# Patient Record
Sex: Female | Born: 1990 | Race: Black or African American | Hispanic: No | Marital: Single | Smoking: Current every day smoker
Health system: Southern US, Community
[De-identification: ages and names within clinical notes are randomized; demographics above are authoritative.]

---

## 2017-01-16 ENCOUNTER — Other Ambulatory Visit: Payer: Self-pay

## 2017-01-16 ENCOUNTER — Emergency Department
Admission: EM | Admit: 2017-01-16 | Discharge: 2017-01-16 | Disposition: A | Discharge status: Home / Self Care | Payer: Managed Care, Other (non HMO) | Attending: Emergency Medicine | Admitting: Emergency Medicine

## 2017-01-16 ENCOUNTER — Encounter: Payer: Self-pay | Admitting: Physician Assistant

## 2017-01-16 DIAGNOSIS — H1030 Unspecified acute conjunctivitis, unspecified eye: Secondary | ICD-10-CM

## 2017-01-16 DIAGNOSIS — H10023 Other mucopurulent conjunctivitis, bilateral: Secondary | ICD-10-CM | POA: Insufficient documentation

## 2017-01-16 DIAGNOSIS — H5713 Ocular pain, bilateral: Secondary | ICD-10-CM | POA: Diagnosis present

## 2017-01-16 DIAGNOSIS — F17200 Nicotine dependence, unspecified, uncomplicated: Secondary | ICD-10-CM | POA: Insufficient documentation

## 2017-01-16 MED ORDER — TOBRAMYCIN 0.3 % OP SOLN: 2.0000 [drp] | OPHTHALMIC | 0 refills | Status: DC

## 2017-01-16 NOTE — Discharge Instructions (Signed)
Follow-up with your regular doctor or the eye doctor who is phone number we have given you if you are not better in 3 days, if you feel that your vision is becoming worse or you are having more eye pain please return to the emergency department, use the medication as prescribed

## 2017-01-16 NOTE — ED Provider Notes (Signed)
[facility] Emergency Department Provider Note  ____________________________________________   First MD Initiated Contact with Patient 01/16/17 1156     (approximate)  I have reviewed the triage vital signs and the nursing notes.   HISTORY  Chief Complaint Eye Pain    HPI Sherry Martin is a 27 y.o. 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 No pertinent past medical history.  There are no active problems to display for this patient.   Past Surgical History:  Procedure Laterality Date  . CESAREAN SECTION      Prior to Admission medications   Medication Sig Start Date End Date Taking? Authorizing Provider  tobramycin (TOBREX) 0.3 % ophthalmic solution Place 2 drops into the left eye every 4 (four) hours. 01/16/17   Faythe Ghee, PA-C    Allergies Patient has no known allergies.  No family history on file.  Social History Social History   Tobacco Use  . Smoking status: Current Every Day Smoker  . Smokeless tobacco: Never Used  Substance Use Topics  . Alcohol use: No    Frequency: Never  . Drug use: No    Review of Systems  Constitutional: No fever/chills Eyes: some visual changes with blurred vision, positive for drainage and crusting ENT: No sore throat. Respiratory: Denies cough Genitourinary: Negative for dysuria. Musculoskeletal: Negative for back pain. Skin: Negative for rash.    ____________________________________________   PHYSICAL EXAM:  VITAL SIGNS: ED Triage Vitals  Enc Vitals Group     BP 01/16/17 1201 125/68     Pulse Rate  01/16/17 1201 79     Resp 01/16/17 1201 16     Temp 01/16/17 1201 98.1 F (36.7 C)     Temp Source 01/16/17 1201 Oral     SpO2 01/16/17 1201 99 %     Weight 01/16/17 1202 205 lb (93 kg)     Height 01/16/17 1202 4\' 11"  (1.499 m)     Head Circumference --      Peak Flow --      Pain Score 01/16/17 1159 9     Pain Loc --      Pain Edu? --      Excl. in GC? --     Constitutional: Alert and oriented. Well appearing and in no acute distress. Eyes: Conjunctivae are inflamed, some crusting on lashes b/l, eomi intact, no pain is reproduced with movement, visual acuity is grossly normal Head: Atraumatic. Nose: No congestion/rhinnorhea. Mouth/Throat: Mucous membranes are moist.   Cardiovascular: Normal rate, regular rhythm.heart sounds are normal Respiratory: Normal respiratory effort.  No retractions, lungs are c t a GU: deferred Musculoskeletal: FROM all extremities, warm and well perfused Neurologic:  Normal speech and language.  Skin:  Skin is warm, dry and intact. No rash noted. Psychiatric: Mood and affect are normal. Speech and behavior are normal.  ____________________________________________   LABS (all labs ordered are listed, but only abnormal results are displayed)  Labs Reviewed - No data to display ____________________________________________   ____________________________________________  RADIOLOGY   ____________________________________________   PROCEDURES  Procedure(s) performed: No  Procedures  ____________________________________________   INITIAL IMPRESSION / ASSESSMENT AND PLAN / ED COURSE  Pertinent labs & imaging results that were available during my care of the patient were reviewed by me and considered in my medical decision making (see chart for details).  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, she  denies fever chills  On physical exam there is crusting on the lashes conjunctiva are irritated, extraocular motions were intact, her vision appears to be grossly intact, she is able to read the sign across the room  Diagnosis is acute conjunctivitis  Patient was given a prescription for tobramycin ophthalmic drops, she is to follow-up with her regular doctor or ophthalmology if not better in 3 days, she develops increased eye pain and has changes in vision she is to see ophthalmology as soon as possible, or she could return to the emergency department, she was given a work note as requested, she states she will comply with our recommendations, she is discharged in stable condition     As part of my medical decision making, I reviewed the following data within the electronic MEDICAL RECORD NUMBER  ____________________________________________   FINAL CLINICAL IMPRESSION(S) / ED DIAGNOSES  Final diagnoses:  Acute bacterial conjunctivitis, unspecified laterality      NEW MEDICATIONS STARTED DURING THIS VISIT:  Discharge Medication List as of 01/16/2017 12:14 PM    START taking these medications   Details  tobramycin (TOBREX) 0.3 % ophthalmic solution Place 2 drops into the left eye every 4 (four) hours., Starting Fri 01/16/2017, Print         Note:  This document was prepared using Dragon voice recognition software and may include unintentional dictation errors.    Faythe GheeFisher, Elmer Merwin W, PA-C 01/16/17 1455    Jene EveryKinner, Robert, MD 01/16/17 [phone]

## 2017-01-16 NOTE — ED Triage Notes (Signed)
Per patient's report, she has experienced bilateral eye pain, drainage and photosensitivity since starting a new job. Patient c/o burning pain around eyes. Patient c/o nasal congestion.

## 2018-02-06 ENCOUNTER — Encounter: Payer: Self-pay | Admitting: Emergency Medicine

## 2018-02-06 ENCOUNTER — Other Ambulatory Visit: Payer: Self-pay

## 2018-02-06 ENCOUNTER — Emergency Department
Admission: EM | Admit: 2018-02-06 | Discharge: 2018-02-06 | Disposition: A | Discharge status: Home / Self Care | Payer: Managed Care, Other (non HMO) | Attending: Student in an Organized Health Care Education/Training Program | Admitting: Student in an Organized Health Care Education/Training Program

## 2018-02-06 DIAGNOSIS — B9689 Other specified bacterial agents as the cause of diseases classified elsewhere: Secondary | ICD-10-CM

## 2018-02-06 DIAGNOSIS — N760 Acute vaginitis: Secondary | ICD-10-CM | POA: Insufficient documentation

## 2018-02-06 DIAGNOSIS — F17200 Nicotine dependence, unspecified, uncomplicated: Secondary | ICD-10-CM | POA: Insufficient documentation

## 2018-02-06 DIAGNOSIS — Z202 Contact with and (suspected) exposure to infections with a predominantly sexual mode of transmission: Secondary | ICD-10-CM | POA: Insufficient documentation

## 2018-02-06 LAB — URINALYSIS, COMPLETE (UACMP) WITH MICROSCOPIC
Bacteria, UA: NONE SEEN
Bilirubin Urine: NEGATIVE
GLUCOSE, UA: NEGATIVE mg/dL
Hgb urine dipstick: NEGATIVE
KETONES UR: NEGATIVE mg/dL
Nitrite: NEGATIVE
PROTEIN: NEGATIVE mg/dL
Specific Gravity, Urine: 1.009 (ref 1.005–1.030)
pH: 7 (ref 5.0–8.0)

## 2018-02-06 LAB — WET PREP, GENITAL
Sperm: NONE SEEN
Trich, Wet Prep: NONE SEEN
Yeast Wet Prep HPF POC: NONE SEEN

## 2018-02-06 LAB — CHLAMYDIA/NGC RT PCR (ARMC ONLY)
CHLAMYDIA TR: NOT DETECTED
N gonorrhoeae: DETECTED — AB

## 2018-02-06 LAB — POCT PREGNANCY, URINE: Preg Test, Ur: NEGATIVE

## 2018-02-06 MED ORDER — LIDOCAINE HCL (PF) 1 % IJ SOLN
5.0000 mL | Freq: Once | INTRAMUSCULAR | Status: AC
  Administered 2018-02-06: 5 mL
  Filled 2018-02-06: qty 5

## 2018-02-06 MED ORDER — CEFTRIAXONE SODIUM 250 MG IJ SOLR
250.0000 mg | Freq: Once | INTRAMUSCULAR | Status: AC
  Administered 2018-02-06: 250 mg via INTRAMUSCULAR
  Filled 2018-02-06: qty 250

## 2018-02-06 MED ORDER — AZITHROMYCIN 500 MG PO TABS
1000.0000 mg | ORAL_TABLET | Freq: Once | ORAL | Status: AC
  Administered 2018-02-06: 1000 mg via ORAL
  Filled 2018-02-06: qty 2

## 2018-02-06 MED ORDER — SULFAMETHOXAZOLE-TRIMETHOPRIM 800-160 MG PO TABS: 1.0000 | ORAL_TABLET | Freq: Two times a day (BID) | ORAL | 0 refills | Status: DC

## 2018-02-06 MED ORDER — METRONIDAZOLE 500 MG PO TABS: 500.0000 mg | ORAL_TABLET | Freq: Two times a day (BID) | ORAL | 0 refills | Status: AC

## 2018-02-06 NOTE — ED Triage Notes (Signed)
Frequency of urination began this am.

## 2018-02-06 NOTE — Discharge Instructions (Addendum)
Follow-up with your primary care provider or can no clinic acute care if any continued problems.  Begin taking antibiotics as directed for the entire 7-day course, this medication is for the bacterial vaginosis.  While in the emergency department you were treated for both gonorrhea and chlamydia.  Should you wish to have testing for HIV or syphilis you may go to the health department and be tested for free.  No sexual activity for 7 to 10 days.

## 2018-02-06 NOTE — ED Provider Notes (Signed)
[facility] Emergency Department Provider Note  ____________________________________________   First MD Initiated Contact with Patient 02/06/18 [phone]     (approximate)  I have reviewed the triage vital signs and the nursing notes.   HISTORY  Chief Complaint Dysuria   HPI Sherry Martin is a 28 y.o. female   presents to the ED with frequency of urination beginning this morning.  Patient states that she has had urinary tract infections before and this feels very similar.  Her last UTI was 1 year ago.  Patient denies any nausea, vomiting, fever or chills.  There is been no flank pain.  She is also concerned about STDs.  She states that her boyfriend is accusing her of cheating on him.  She states that he was also seen in the ED but is uncertain if he was diagnosed with an STI.  She denies any pelvic pain or vaginal discharge.   History reviewed. No pertinent past medical history.  There are no active problems to display for this patient.   Past Surgical History:  Procedure Laterality Date  . CESAREAN SECTION      Prior to Admission medications   Medication Sig Start Date End Date Taking? Authorizing Provider  metroNIDAZOLE (FLAGYL) 500 MG tablet Take 1 tablet (500 mg total) by mouth 2 (two) times daily. 02/06/18   Tommi Rumps, PA-C    Allergies Patient has no known allergies.  No family history on file.  Social History Social History   Tobacco Use  . Smoking status: Current Every Day Smoker  . Smokeless tobacco: Never Used  Substance Use Topics  . Alcohol use: No    Frequency: Never  . Drug use: No    Review of Systems Constitutional: No fever/chills Cardiovascular: Denies chest pain. Respiratory: Denies shortness of breath. Gastrointestinal: No abdominal pain.  No nausea, no vomiting.  Genitourinary: Positive for dysuria.  Negative for vaginal discharge. Musculoskeletal: Negative for flank pain. Skin: Negative for  rash. Neurological: Negative for headaches, focal weakness or numbness. ___________________________________________   PHYSICAL EXAM:  VITAL SIGNS: ED Triage Vitals  Enc Vitals Group     BP 02/06/18 0835 119/75     Pulse Rate 02/06/18 0835 100     Resp 02/06/18 0835 20     Temp 02/06/18 0835 98.6 F (37 C)     Temp Source 02/06/18 0835 Oral     SpO2 02/06/18 0835 95 %     Weight 02/06/18 0838 215 lb (97.5 kg)     Height 02/06/18 0838 4\' 11"  (1.499 m)     Head Circumference --      Peak Flow --      Pain Score 02/06/18 0837 0     Pain Loc --      Pain Edu? --      Excl. in GC? --    Constitutional: Alert and oriented. Well appearing and in no acute distress. Eyes: Conjunctivae are normal. Head: Atraumatic. Neck: No stridor.   Cardiovascular: Normal rate, regular rhythm. Grossly normal heart sounds.  Good peripheral circulation. Respiratory: Normal respiratory effort.  No retractions. Lungs CTAB. Gastrointestinal: Soft and nontender. No distention. No abdominal bruits. No CVA tenderness. Genitourinary: External genitalia is unremarkable.  There is some thin white secretions along the vaginal vault.  Swabs were obtained.  No adnexal masses or tenderness was noted.  No cervical motion tenderness present. Musculoskeletal: Moves upper and lower extremities without any difficulty and normal gait was noted. Neurologic:  Normal speech and  language. No gross focal neurologic deficits are appreciated. No gait instability. Skin:  Skin is warm, dry and intact.  Psychiatric: Mood and affect are normal. Speech and behavior are normal.  ____________________________________________   LABS (all labs ordered are listed, but only abnormal results are displayed)  Labs Reviewed  WET PREP, GENITAL - Abnormal; Notable for the following components:      Result Value   Clue Cells Wet Prep HPF POC PRESENT (*)    WBC, Wet Prep HPF POC MODERATE (*)    All other components within normal limits   URINALYSIS, COMPLETE (UACMP) WITH MICROSCOPIC - Abnormal; Notable for the following components:   Color, Urine YELLOW (*)    APPearance HAZY (*)    Leukocytes, UA SMALL (*)    All other components within normal limits  CHLAMYDIA/NGC RT PCR ([facility] ONLY)  POC URINE PREG, ED  POCT PREGNANCY, URINE    PROCEDURES  Procedure(s) performed: None  Procedures  Critical Care performed: No  ____________________________________________   INITIAL IMPRESSION / ASSESSMENT AND PLAN / ED COURSE  As part of my medical decision making, I reviewed the following data within the electronic MEDICAL RECORD NUMBER Notes from prior ED visits and [place] Controlled Substance Database  Patient presents to the ED with urinary frequency.  She also is concerned for exposure to STD.  She states that her boyfriend currently is being seen and a another area of the ED.  Wet prep showed clue cells and patient was made aware of bacterial vaginosis.  Patient received information about boyfriend's test results being positive for GC and chlamydia.  Without confirming to patient boyfriend's results it was decided that she would also be treated for gonorrhea and chlamydia with Rocephin and Zithromax.  She still will obtain the Flagyl and encouraged to follow-up with the [facility] health department if HIV and RPR is desired. ____________________________________________   FINAL CLINICAL IMPRESSION(S) / ED DIAGNOSES  Final diagnoses:  BV (bacterial vaginosis)  Exposure to sexually transmitted disease (STD)     ED Discharge Orders         Ordered    sulfamethoxazole-trimethoprim (BACTRIM DS,SEPTRA DS) 800-160 MG tablet  2 times daily,   Status:  Discontinued     02/06/18 0959    metroNIDAZOLE (FLAGYL) 500 MG tablet  2 times daily     02/06/18 1215           Note:  This document was prepared using Dragon voice recognition software and may include unintentional dictation errors.    Tommi Rumps, PA-C 02/06/18  1241    Willy Eddy, MD 02/06/18 [phone]

## 2018-12-04 ENCOUNTER — Encounter: Payer: Self-pay | Admitting: Emergency Medicine

## 2018-12-04 ENCOUNTER — Emergency Department: Payer: Self-pay

## 2018-12-04 ENCOUNTER — Other Ambulatory Visit: Payer: Self-pay

## 2018-12-04 ENCOUNTER — Emergency Department
Admission: EM | Admit: 2018-12-04 | Discharge: 2018-12-04 | Discharge status: Against Medical Advice | Payer: Self-pay | Attending: Emergency Medicine | Admitting: Emergency Medicine

## 2018-12-04 DIAGNOSIS — R0789 Other chest pain: Secondary | ICD-10-CM | POA: Insufficient documentation

## 2018-12-04 DIAGNOSIS — F17210 Nicotine dependence, cigarettes, uncomplicated: Secondary | ICD-10-CM | POA: Insufficient documentation

## 2018-12-04 DIAGNOSIS — R079 Chest pain, unspecified: Secondary | ICD-10-CM

## 2018-12-04 LAB — BASIC METABOLIC PANEL
Anion gap: 10 (ref 5–15)
BUN: 14 mg/dL (ref 6–20)
CO2: 23 mmol/L (ref 22–32)
Calcium: 9.3 mg/dL (ref 8.9–10.3)
Chloride: 102 mmol/L (ref 98–111)
Creatinine, Ser: 0.91 mg/dL (ref 0.44–1.00)
GFR calc Af Amer: >60 mL/min (ref >60)
GFR calc non Af Amer: >60 mL/min (ref >60)
Glucose, Bld: 100 mg/dL — ABNORMAL HIGH (ref 70–99)
Potassium: 3.9 mmol/L (ref 3.5–5.1)
Sodium: 135 mmol/L (ref 135–145)

## 2018-12-04 LAB — POCT PREGNANCY, URINE: Preg Test, Ur: NEGATIVE

## 2018-12-04 LAB — CBC
HCT: 38.5 % (ref 36.0–46.0)
Hemoglobin: 13 g/dL (ref 12.0–15.0)
MCH: 28.1 pg (ref 26.0–34.0)
MCHC: 33.8 g/dL (ref 30.0–36.0)
MCV: 83.2 fL (ref 80.0–100.0)
Platelets: 299 10*3/uL (ref 150–400)
RBC: 4.63 MIL/uL (ref 3.87–5.11)
RDW: 12.5 % (ref 11.5–15.5)
WBC: 8.9 10*3/uL (ref 4.0–10.5)
nRBC: 0 % (ref 0.0–0.2)

## 2018-12-04 LAB — FIBRIN DERIVATIVES D-DIMER (ARMC ONLY): Fibrin derivatives D-dimer (ARMC): 897.11 ng/mL (FEU) — ABNORMAL HIGH (ref 0.00–499.00)

## 2018-12-04 LAB — TROPONIN I (HIGH SENSITIVITY): Troponin I (High Sensitivity): <2 ng/L (ref <18)

## 2018-12-04 MED ORDER — SODIUM CHLORIDE 0.9% FLUSH: 3.0000 mL | Freq: Once | INTRAVENOUS | Status: DC

## 2018-12-04 MED ORDER — ACETAMINOPHEN 500 MG PO TABS
1000.0000 mg | ORAL_TABLET | Freq: Once | ORAL | Status: AC
  Administered 2018-12-04: 1000 mg via ORAL
  Filled 2018-12-04: qty 2

## 2018-12-04 NOTE — ED Provider Notes (Addendum)
[facility] Emergency Department Provider Note  ____________________________________________   First MD Initiated Contact with Patient 12/04/18 1121     (approximate)  I have reviewed the triage vital signs and the nursing notes.   HISTORY  Chief Complaint Chest Pain    HPI Sherry Martin is a 28 y.o. female who presents with right-sided chest pain rating to her back that started yesterday.  The pain started yesterday, constant, worse with taking a deep breath, has not take anything to help with the pain.  She denies any SOB. Does have asthma but this feels different.  No heavy lifting. No leg swelling, no recent surgeries, estrogen use, recent long travel.  No prior history of blood clots.           History reviewed. No pertinent past medical history.  There are no active problems to display for this patient.   Past Surgical History:  Procedure Laterality Date  . CESAREAN SECTION      Prior to Admission medications   Medication Sig Start Date End Date Taking? Authorizing Provider  metroNIDAZOLE (FLAGYL) 500 MG tablet Take 1 tablet (500 mg total) by mouth 2 (two) times daily. 02/06/18   Tommi Rumps, PA-C    Allergies Patient has no known allergies.  No family history on file.  Social History Social History   Tobacco Use  . Smoking status: Current Every Day Smoker  . Smokeless tobacco: Never Used  Substance Use Topics  . Alcohol use: No    Frequency: Never  . Drug use: No      Review of Systems Constitutional: No fever/chills Eyes: No visual changes. ENT: No sore throat. Cardiovascular: Positive chest pain Respiratory: Denies shortness of breath. Gastrointestinal: No abdominal pain.  No nausea, no vomiting.  No diarrhea.  No constipation. Genitourinary: Negative for dysuria. Musculoskeletal: Negative for back pain. Skin: Negative for rash. Neurological: Negative for headaches, focal weakness or numbness. All other ROS  negative ____________________________________________   PHYSICAL EXAM:  VITAL SIGNS: ED Triage Vitals  Enc Vitals Group     BP 12/04/18 1056 128/75     Pulse Rate 12/04/18 1056 (!) 102     Resp 12/04/18 1056 20     Temp 12/04/18 1056 99.2 F (37.3 C)     Temp Source 12/04/18 1056 Oral     SpO2 12/04/18 1056 98 %     Weight 12/04/18 1057 215 lb (97.5 kg)     Height 12/04/18 1057 4' 11.5" (1.511 m)     Head Circumference --      Peak Flow --      Pain Score 12/04/18 1057 5     Pain Loc --      Pain Edu? --      Excl. in GC? --     Constitutional: Alert and oriented. Well appearing and in no acute distress. Eyes: Conjunctivae are normal. EOMI. Head: Atraumatic. Nose: No congestion/rhinnorhea. Mouth/Throat: Mucous membranes are moist.   Neck: No stridor. Trachea Midline. FROM Cardiovascular: Normal rate, regular rhythm. Grossly normal heart sounds.  Good peripheral circulation.  No chest wall tenderness.  No erythema. Respiratory: Normal respiratory effort.  No retractions. Lungs CTAB. Gastrointestinal: Soft and nontender. No distention. No abdominal bruits.  Musculoskeletal: No lower extremity tenderness nor edema.  No joint effusions. Neurologic:  Normal speech and language. No gross focal neurologic deficits are appreciated.  Skin:  Skin is warm, dry and intact. No rash noted. Psychiatric: Mood and affect are normal. Speech and  behavior are normal. GU: Deferred   ____________________________________________   LABS (all labs ordered are listed, but only abnormal results are displayed)  Labs Reviewed  BASIC METABOLIC PANEL - Abnormal; Notable for the following components:      Result Value   Glucose, Bld 100 (*)    All other components within normal limits  FIBRIN DERIVATIVES D-DIMER ([facility] ONLY) - Abnormal; Notable for the following components:   Fibrin derivatives D-dimer ([facility]) 897.11 (*)    All other components within normal limits  CBC  POC URINE PREG, ED   POCT PREGNANCY, URINE  TROPONIN I (HIGH SENSITIVITY)  TROPONIN I (HIGH SENSITIVITY)   ____________________________________________   ED ECG REPORT I, Vanessa Williford, the attending physician, personally viewed and interpreted this ECG.  EKG is sinus tachycardia rate of 100, no ST elevation, T wave inversion in lead III.  Patient does have S1Q3T3. ____________________________________________  RADIOLOGY Robert Bellow, personally viewed and evaluated these images (plain radiographs) as part of my medical decision making, as well as reviewing the written report by the radiologist.  ED MD interpretation: No pneumonia  Official radiology report(s): Dg Chest 2 View  Result Date: 12/04/2018 CLINICAL DATA:  RIGHT-sided chest pain radiating to back since yesterday. No known injury. Current smoker. EXAM: CHEST - 2 VIEW COMPARISON:  None. FINDINGS: The heart size and mediastinal contours are within normal limits. Both lungs are clear. No pleural effusion or pneumothorax is seen. The visualized skeletal structures are unremarkable. IMPRESSION: Normal chest x-ray. No evidence of pneumonia or pulmonary edema. Electronically Signed   By: Franki Cabot M.D.   On: 12/04/2018 12:25    ____________________________________________   PROCEDURES  Procedure(s) performed (including Critical Care):  Procedures   ____________________________________________   INITIAL IMPRESSION / ASSESSMENT AND PLAN / ED COURSE   Sherry Martin was evaluated in Emergency Department on 12/04/2018 for the symptoms described in the history of present illness. She was evaluated in the context of the global COVID-19 pandemic, which necessitated consideration that the patient might be at risk for infection with the SARS-CoV-2 virus that causes COVID-19. Institutional protocols and algorithms that pertain to the evaluation of patients at risk for COVID-19 are in a state of rapid change based on information released by  regulatory bodies including the CDC and federal and state organizations. These policies and algorithms were followed during the patient's care in the ED.    Most Likely DDx:  -MSK (atypical chest pain) but will get cardiac markers to evaluate for ACS and D-dimer to evaluate for PE given the tachycardia and EKG changes.  I offered initially start off a CT scan but patient would like to try to avoid IV at all cost.  She is okay with doing the D-dimer and if positive will try to convince her to get the IV.   DDx that was also considered d/t potential to cause harm, but was found less likely based on history and physical (as detailed above): -PNA (no fevers, cough but CXR to evaluate) -PNX (reassured with equal b/l breath sounds, CXR to evaluate) -Symptomatic anemia (will get H&H) -Aortic Dissection as no tearing pain and no radiation to the mid back, pulses equal -Pericarditis no rub on exam, EKG changes or hx to suggest dx -Tamponade (no notable SOB, tachycardic, hypotensive) -Esophageal rupture (no h/o diffuse vomitting/no crepitus)  Pregnancy test is negative.  Trop Was negative and this is been going on for greater than 3 hours therefore rules out for ACS.  D-dimer was  elevated at 897.  I discussed with patient that I would recommend CT imaging to rule out PE versus VQ scan.  Patient does not want to stay for further testing.  I think there is real low utility in getting ultrasounds of her legs given she has no leg tenderness or swelling.  Patient understands the risk including death or permanent disability from missed PE.  She says she will re present to the ER if she develops worsening shortness of breath however at this time she does not want any further testing.  She is adamant that the pain is now resolved with the Tylenol and that she thinks it was more likely a pulled muscle.  I explained patient the EKG changes as well as reported chest pain and elevated D-dimer did make me concerned for  PE but patient does not want further testing regardless.  Patient was able to repeat back to me the risk including death and permanent disability.  Patient has capacity to make this decision at this time.  She understands the return precautions for pulmonary embolism.   Pt left AMA.      ____________________________________________   FINAL CLINICAL IMPRESSION(S) / ED DIAGNOSES   Final diagnoses:  Chest pain, unspecified type     MEDICATIONS GIVEN DURING THIS VISIT:  Medications  sodium chloride flush (NS) 0.9 % injection 3 mL (has no administration in time range)  acetaminophen (TYLENOL) tablet 1,000 mg (1,000 mg Oral Given 12/04/18 1204)     ED Discharge Orders    None       Note:  This document was prepared using Dragon voice recognition software and may include unintentional dictation errors.   Concha SeFunke, Tatem Holsonback E, MD 12/04/18 1250    Concha SeFunke, Ramsie Ostrander E, MD 12/04/18 [phone]

## 2018-12-04 NOTE — ED Triage Notes (Signed)
R sided chest pain radiating to back since yesterday. No known injury.

## 2018-12-04 NOTE — ED Notes (Addendum)
Encouraged pt to stay for further work up - she declined - reiterated for her to return if symptoms persisted or got worse - pt refused vitals - pt left AMA - Dr Jari Pigg is aware

## 2018-12-04 NOTE — Discharge Instructions (Addendum)
Your D-dimer was elevated and your EKG was concerning for the possibility of a pulmonary embolism.  I recommended staying for further testing to evaluate for this.  You preferred to go home and see how your symptoms are doing.  Please return to the emergency room as soon as possible if you develop shortness of breath, leg swelling or any other concerns.  Otherwise you take 1 g of Tylenol every 8 hours to help with the discomfort.

## 2019-02-05 ENCOUNTER — Other Ambulatory Visit: Payer: Self-pay

## 2019-02-05 ENCOUNTER — Emergency Department: Payer: Self-pay

## 2019-02-05 ENCOUNTER — Encounter: Payer: Self-pay | Admitting: Intensive Care

## 2019-02-05 ENCOUNTER — Emergency Department
Admission: EM | Admit: 2019-02-05 | Discharge: 2019-02-05 | Disposition: A | Discharge status: Home / Self Care | Payer: Self-pay | Attending: Emergency Medicine | Admitting: Emergency Medicine

## 2019-02-05 DIAGNOSIS — R102 Pelvic and perineal pain: Secondary | ICD-10-CM

## 2019-02-05 DIAGNOSIS — F17210 Nicotine dependence, cigarettes, uncomplicated: Secondary | ICD-10-CM | POA: Insufficient documentation

## 2019-02-05 DIAGNOSIS — Z3491 Encounter for supervision of normal pregnancy, unspecified, first trimester: Secondary | ICD-10-CM | POA: Insufficient documentation

## 2019-02-05 DIAGNOSIS — O99331 Smoking (tobacco) complicating pregnancy, first trimester: Secondary | ICD-10-CM | POA: Insufficient documentation

## 2019-02-05 LAB — URINALYSIS, COMPLETE (UACMP) WITH MICROSCOPIC
Bacteria, UA: NONE SEEN
Bilirubin Urine: NEGATIVE
Glucose, UA: NEGATIVE mg/dL
Hgb urine dipstick: NEGATIVE
Ketones, ur: 5 mg/dL — AB
Nitrite: NEGATIVE
Protein, ur: NEGATIVE mg/dL
Specific Gravity, Urine: 1.02 (ref 1.005–1.030)
pH: 6 (ref 5.0–8.0)

## 2019-02-05 LAB — COMPREHENSIVE METABOLIC PANEL
ALT: 13 U/L (ref 0–44)
AST: 16 U/L (ref 15–41)
Albumin: 3.9 g/dL (ref 3.5–5.0)
Alkaline Phosphatase: 50 U/L (ref 38–126)
Anion gap: 8 (ref 5–15)
BUN: 16 mg/dL (ref 6–20)
CO2: 24 mmol/L (ref 22–32)
Calcium: 9.1 mg/dL (ref 8.9–10.3)
Chloride: 106 mmol/L (ref 98–111)
Creatinine, Ser: 0.91 mg/dL (ref 0.44–1.00)
GFR calc Af Amer: >60 mL/min (ref >60)
GFR calc non Af Amer: >60 mL/min (ref >60)
Glucose, Bld: 99 mg/dL (ref 70–99)
Potassium: 4 mmol/L (ref 3.5–5.1)
Sodium: 138 mmol/L (ref 135–145)
Total Bilirubin: 0.4 mg/dL (ref 0.3–1.2)
Total Protein: 8.1 g/dL (ref 6.5–8.1)

## 2019-02-05 LAB — LIPASE, BLOOD: Lipase: 20 U/L (ref 11–51)

## 2019-02-05 LAB — POCT PREGNANCY, URINE: Preg Test, Ur: POSITIVE — AB

## 2019-02-05 LAB — CBC
HCT: 38.3 % (ref 36.0–46.0)
Hemoglobin: 12.4 g/dL (ref 12.0–15.0)
MCH: 27.9 pg (ref 26.0–34.0)
MCHC: 32.4 g/dL (ref 30.0–36.0)
MCV: 86.3 fL (ref 80.0–100.0)
Platelets: 298 10*3/uL (ref 150–400)
RBC: 4.44 MIL/uL (ref 3.87–5.11)
RDW: 13 % (ref 11.5–15.5)
WBC: 7.4 10*3/uL (ref 4.0–10.5)
nRBC: 0 % (ref 0.0–0.2)

## 2019-02-05 LAB — HCG, QUANTITATIVE, PREGNANCY: hCG, Beta Chain, Quant, S: 2904 m[IU]/mL — ABNORMAL HIGH (ref <5)

## 2019-02-05 NOTE — ED Triage Notes (Signed)
Patient reports positive pregnancy test a couple of weeks ago. Went to abortion clinic and was told they couldn't find sac and was told to come back. Patient went back to abortion clinic today and had another [place], which they reported their was still no sac and to come to ER for possible ectopic pregnancy. Denies vaginal bleeding or discharge. Reports some pelvic cramping and left lower back twinges.

## 2019-02-05 NOTE — ED Notes (Signed)
Pt denies any pain, cramping or bleeding.

## 2019-02-05 NOTE — ED Provider Notes (Signed)
[facility] Emergency Department Provider Note   ____________________________________________    I have reviewed the triage vital signs and the nursing notes.   HISTORY  Chief Complaint Pregnancy    HPI Sherry Martin is a 29 y.o. female who reports her last period was in the middle of December, has opted to have an abortion for this pregnancy.  Has seen an abortion provider recently and during initial ultrasound unable to visualize gestational sac so had repeat ultrasound today, still unable to visualize gestational sac.  Sent to the emergency department for further evaluation.  Patient has no pain or discomfort, she feels well has no complaints.  No vaginal bleeding.  History reviewed. No pertinent past medical history.  There are no problems to display for this patient.   Past Surgical History:  Procedure Laterality Date  . CESAREAN SECTION      Prior to Admission medications   Medication Sig Start Date End Date Taking? Authorizing Provider  metroNIDAZOLE (FLAGYL) 500 MG tablet Take 1 tablet (500 mg total) by mouth 2 (two) times daily. 02/06/18   Tommi Rumps, PA-C     Allergies Patient has no known allergies.  History reviewed. No pertinent family history.  Social History Social History   Tobacco Use  . Smoking status: Current Every Day Smoker    Types: Cigarettes  . Smokeless tobacco: Never Used  Substance Use Topics  . Alcohol use: Yes    Comment: occ  . Drug use: No    Review of Systems  Constitutional: No fever/chills  Respiratory: No cough Gastrointestinal: No abdominal pain.   Genitourinary: Negative for dysuria. Musculoskeletal: Negative for back pain.    ____________________________________________   PHYSICAL EXAM:  VITAL SIGNS: ED Triage Vitals  Enc Vitals Group     BP 02/05/19 1311 137/74     Pulse Rate 02/05/19 1311 96     Resp 02/05/19 1311 15     Temp 02/05/19 1311 99.3 F (37.4 C)     Temp  Source 02/05/19 1311 Oral     SpO2 02/05/19 1311 98 %     Weight 02/05/19 1326 106.1 kg (234 lb)     Height 02/05/19 1326 1.499 m (4\' 11" )     Head Circumference --      Peak Flow --      Pain Score 02/05/19 1326 0     Pain Loc --      Pain Edu? --      Excl. in GC? --     Constitutional: Alert and oriented. No acute distress. Pleasant and interactive Eyes: Conjunctivae are normal.  Head: Atraumatic. Nose: No congestion/rhinnorhea.  Cardiovascular: Normal rate, regular rhythm.  Good peripheral circulation. Respiratory: Normal respiratory effort.  No retractions. Gastrointestinal: Soft and nontender. No distention.  Musculoskeletal:  Warm and well perfused Neurologic:  Normal speech and language. No gross focal neurologic deficits are appreciated.  Skin:  Skin is warm, dry and intact. No rash noted. Psychiatric: Mood and affect are normal. Speech and behavior are normal.  ____________________________________________   LABS (all labs ordered are listed, but only abnormal results are displayed)  Labs Reviewed  URINALYSIS, COMPLETE (UACMP) WITH MICROSCOPIC - Abnormal; Notable for the following components:      Result Value   Color, Urine YELLOW (*)    APPearance HAZY (*)    Ketones, ur 5 (*)    Leukocytes,Ua SMALL (*)    All other components within normal limits  HCG, QUANTITATIVE, PREGNANCY - Abnormal;  Notable for the following components:   hCG, Beta Chain, Quant, S 2,904 (*)    All other components within normal limits  POCT PREGNANCY, URINE - Abnormal; Notable for the following components:   Preg Test, Ur POSITIVE (*)    All other components within normal limits  LIPASE, BLOOD  COMPREHENSIVE METABOLIC PANEL  CBC  POC URINE PREG, ED   ____________________________________________  EKG  None ____________________________________________  RADIOLOGY  Ultrasound pending ____________________________________________   PROCEDURES  Procedure(s) performed: No   Procedures   Critical Care performed: No ____________________________________________   INITIAL IMPRESSION / ASSESSMENT AND PLAN / ED COURSE  Pertinent labs & imaging results that were available during my care of the patient were reviewed by me and considered in my medical decision making (see chart for details).  Patient presents for ultrasound, pending hCG.  She is asymptomatic and has no complaints.  Will obtain ultrasound pelvis, transvaginal.  Have asked Dr. Darl Householder to follow-up on results, anticipate discharge likely with outpatient follow-up    ____________________________________________   FINAL CLINICAL IMPRESSION(S) / ED DIAGNOSES  Final diagnoses:  First trimester pregnancy        Note:  This document was prepared using Dragon voice recognition software and may include unintentional dictation errors.   Lavonia Drafts, MD 02/06/19 [phone]

## 2019-02-05 NOTE — Discharge Instructions (Signed)
Your HCG is 2900. It may be too early to see on the ultrasound   If you have bleeding or pain, you need repeat HCG test in 2 days   If you have no bleeding or pain, you can get repeat ultrasound in 2 weeks   See your OB  Return to ER if you have worse bleeding or pain.

## 2019-02-05 NOTE — ED Notes (Signed)
Pt verbalized understanding of discharge instructions. NAD at this time. 

## 2019-02-05 NOTE — ED Provider Notes (Signed)
  Physical Exam  BP 137/74 (BP Location: Left Arm)   Pulse 96   Temp 99.3 F (37.4 C) (Oral)   Resp 15   Ht 4\' 11"  (1.499 m)   Wt 106.1 kg   LMP 12/18/2018 (Exact Date)   SpO2 98%   BMI 47.26 kg/m   Physical Exam  ED Course/Procedures     Procedures  MDM  Patient care assumed at 3 PM.  Patient had an outpatient ultrasound that did not show an IUP.  Signout pending an hCG level and transvaginal ultrasound.  4:28 PM No IUP visualized but HCG is only 2904. No bleeding or pain. Has follow up with [facility] in [place].  If she has no bleeding or pain she can follow-up with them in 2 weeks.  If she has any signs of ectopic such as bleeding or pain she needs to follow-up in 2 days for repeat hCG check.    [place], MD 02/05/19 [phone]

## 2020-02-14 IMAGING — US US OB < 14 WEEKS - US OB TV
1 series · 13 of 28 positions shown · non-contrast
Comparison: None.

CLINICAL DATA: No IUP seen by outside provider. Quantitative beta
HCG [DATE]. Estimated gestational age per LMP 7 weeks 0 days.

EXAM:
OBSTETRIC <14 WK US AND TRANSVAGINAL OB US
TECHNIQUE: Both transabdominal and transvaginal ultrasound examinations were
performed for complete evaluation of the gestation as well as the
maternal uterus, adnexal regions, and pelvic cul-de-sac.
Transvaginal technique was performed to assess early pregnancy.

[Series 1: us ob < 14 weeks - us ob tv · 13 of 69 slices shown]
[im 3/69]
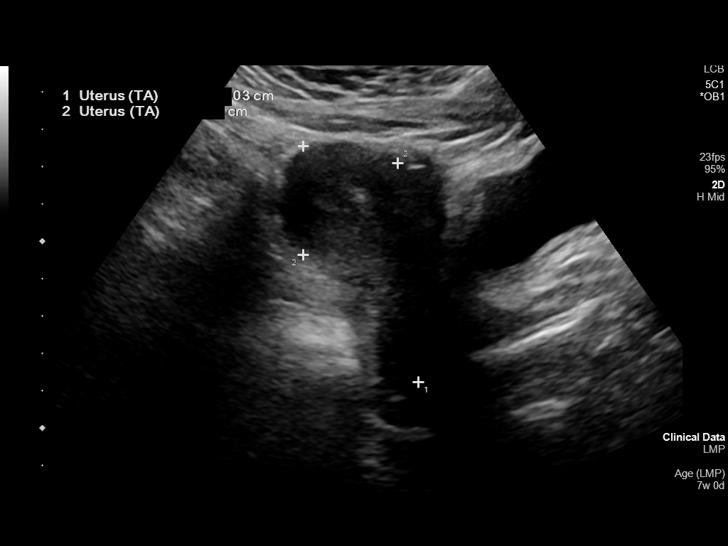
[im 8/69]
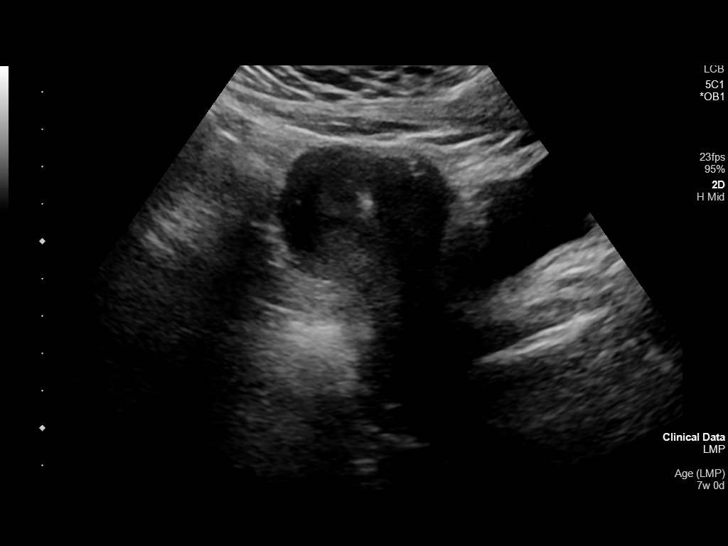
[im 13/69]
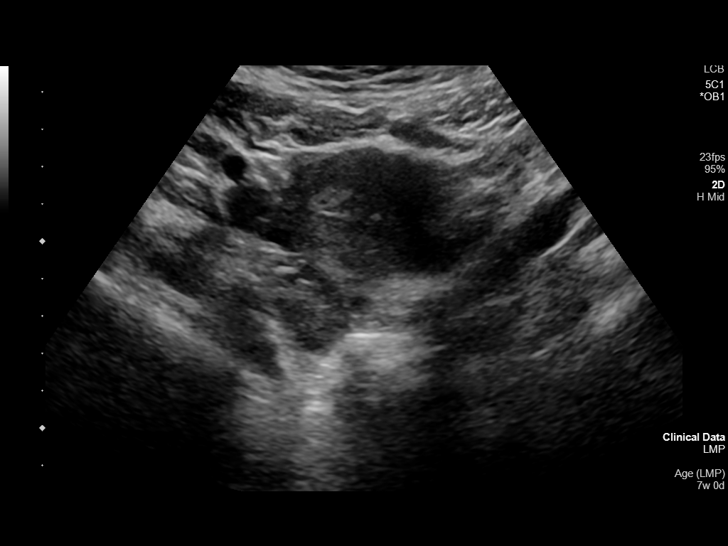
[im 18/69]
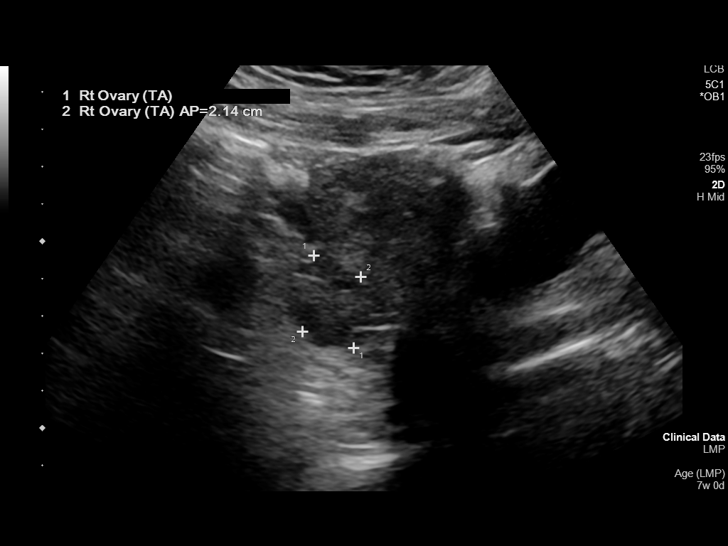
[im 23/69]
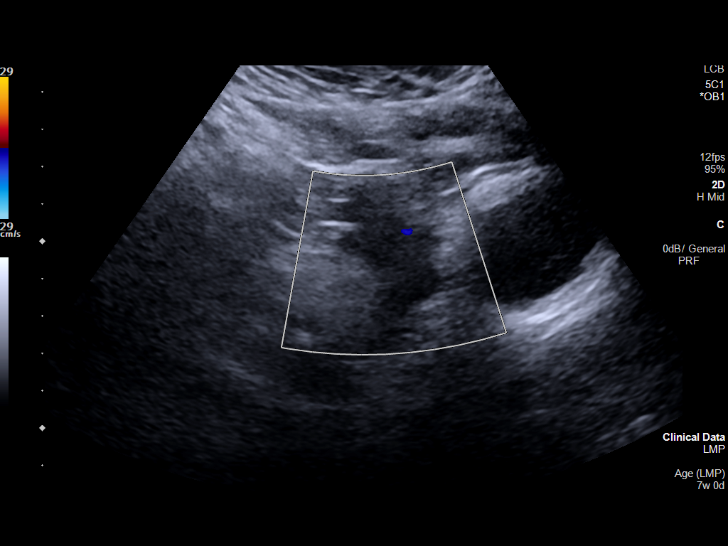
[im 28/69]
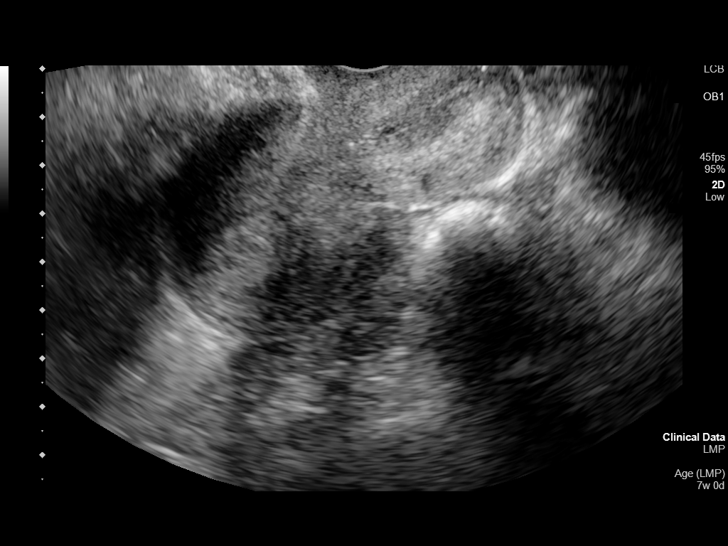
[im 36/69]
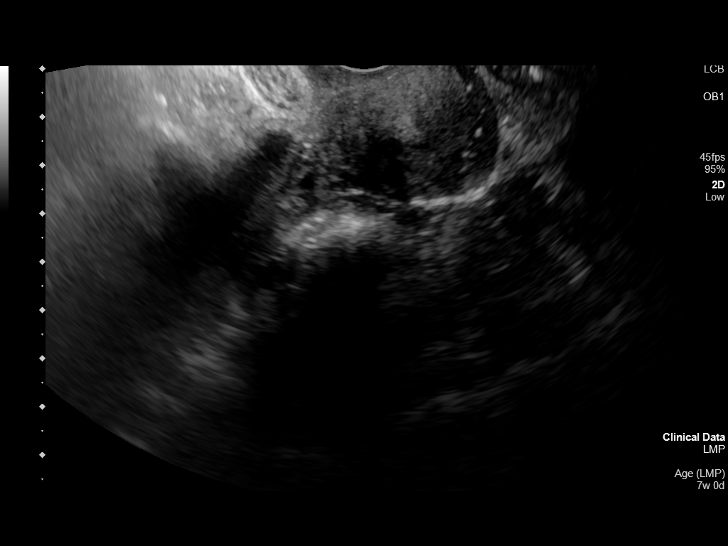
[im 41/69]
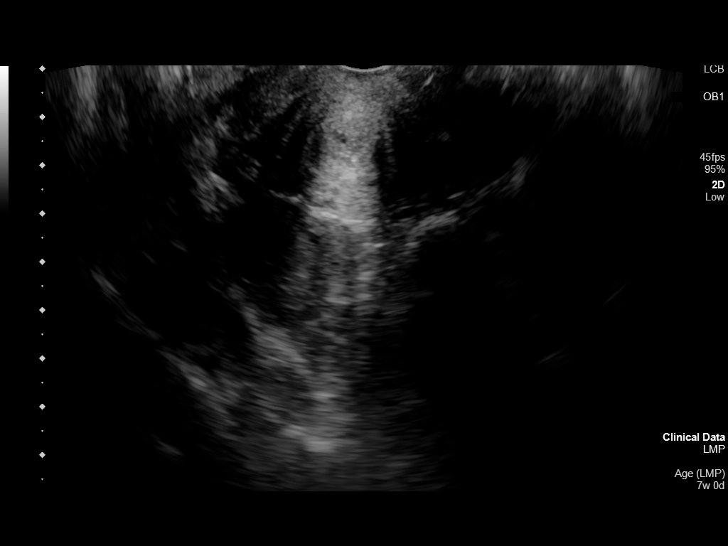
[im 46/69]
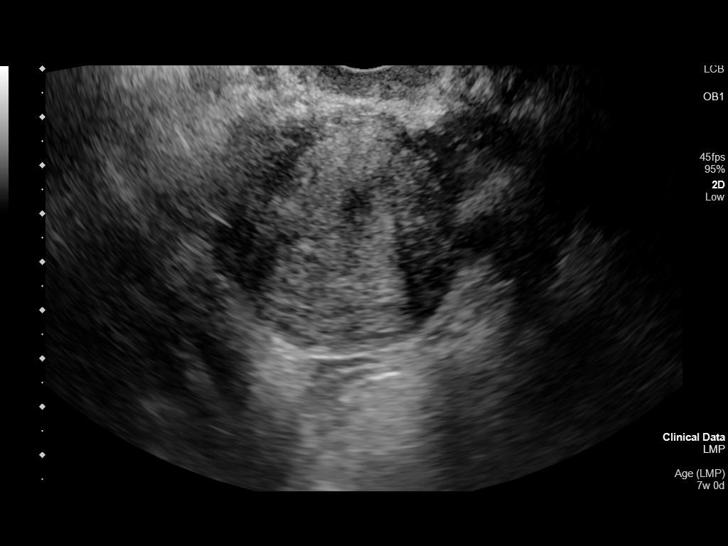
[im 51/69]
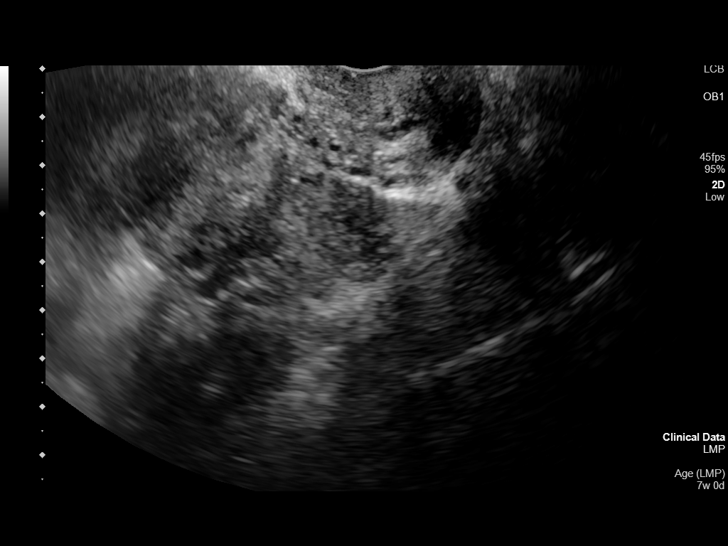
[im 56/69]
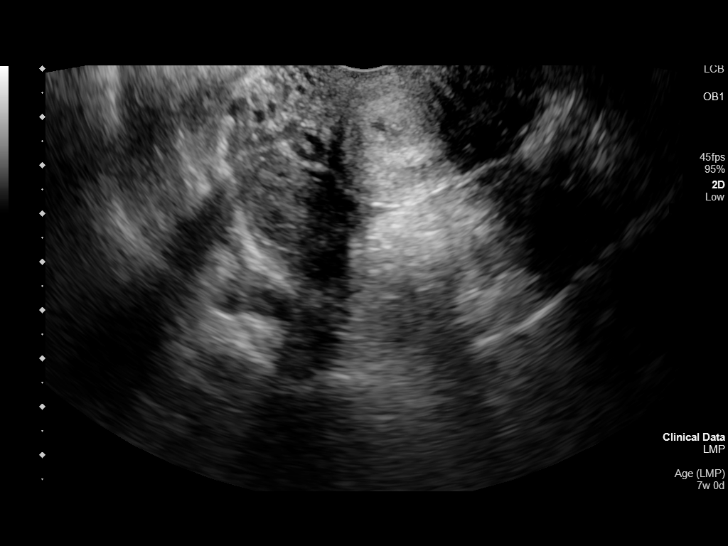
[im 61/69]
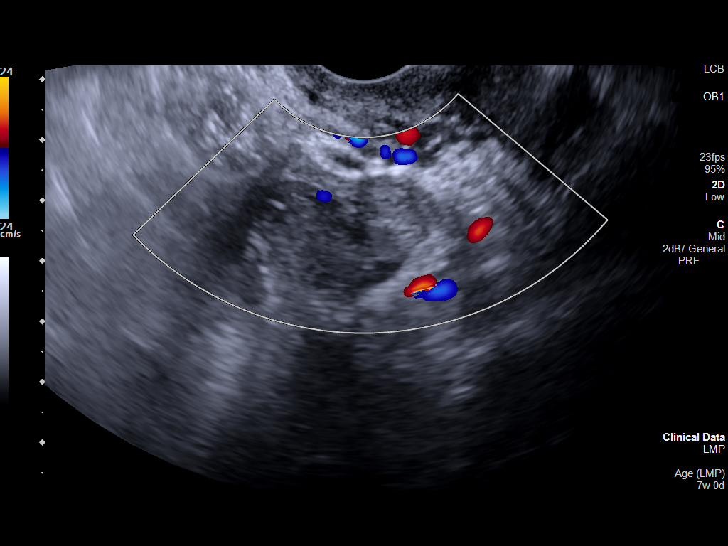
[im 66/69]
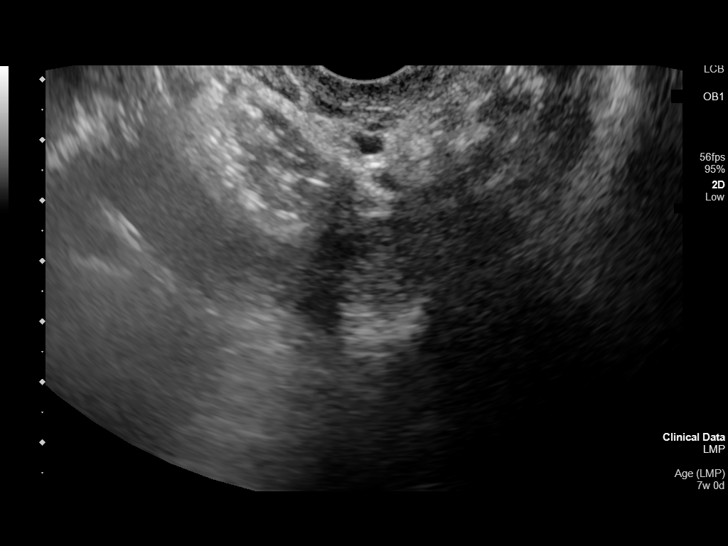

[13 of 28 positions shown; findings below may reference images not displayed]

FINDINGS: Intrauterine gestational sac: Not visualized.

Yolk sac:  Not visualized.

Embryo:  Not visualized.

Cardiac Activity: Not visualized.

Heart Rate: Not visualized.  Bpm

MSD: Not visualized.

CRL:  Not visualized.

Subchorionic hemorrhage:  None visualized.

Maternal uterus/adnexae: Uterus is normal size, shape and position
measuring 8.1 x 4.3 x 4.9 cm. Endometrial thickness is normal
measuring 13 mm. Ovaries are normal size, shape and position with
normal color flow bilaterally. No free fluid.
IMPRESSION: No intrauterine gestational sac, yolk sac, fetal pole, or cardiac
activity visualized. Differential considerations include
intrauterine gestation too early to be sonographically visualized,
spontaneous abortion, or ectopic pregnancy. Consider follow-up
ultrasound in 14 days and serial quantitative beta HCG follow-up.
